# Patient Record
Sex: Female | Born: 1970 | Race: Black or African American | Hispanic: No | Marital: Married
Health system: Southern US, Community
[De-identification: ages and names within clinical notes are randomized; demographics above are authoritative.]

---

## 2013-12-29 ENCOUNTER — Ambulatory Visit: Payer: Self-pay | Admitting: Physician Assistant

## 2013-12-29 LAB — URINALYSIS, COMPLETE
Bacteria: NEGATIVE
Bilirubin,UR: NEGATIVE
GLUCOSE, UR: NEGATIVE
Ketone: NEGATIVE
NITRITE: NEGATIVE
PH: 6 (ref 5.0–8.0)
Protein: 300
SPECIFIC GRAVITY: 1.015 (ref 1.000–1.030)

## 2014-01-10 ENCOUNTER — Emergency Department: Payer: Self-pay | Admitting: Emergency Medicine

## 2014-02-06 ENCOUNTER — Ambulatory Visit: Payer: Self-pay | Admitting: Emergency Medicine

## 2015-10-09 ENCOUNTER — Emergency Department
Admission: EM | Admit: 2015-10-09 | Discharge: 2015-10-09 | Disposition: A | Discharge status: Home / Self Care | Payer: BLUE CROSS/BLUE SHIELD | Attending: Emergency Medicine | Admitting: Emergency Medicine

## 2015-10-09 DIAGNOSIS — Z5321 Procedure and treatment not carried out due to patient leaving prior to being seen by health care provider: Secondary | ICD-10-CM | POA: Insufficient documentation

## 2015-10-09 DIAGNOSIS — K0889 Other specified disorders of teeth and supporting structures: Secondary | ICD-10-CM | POA: Diagnosis present

## 2015-10-09 NOTE — ED Notes (Signed)
Pt in with co toothache yest and tonight woke up with right sided facial swelling.

## 2016-02-17 IMAGING — CR RIGHT INDEX FINGER 2+V
1 series · 3 of 3 positions shown · non-contrast
Comparison: None.

CLINICAL DATA: Slammed hand in car door. Black and blue under nail.
Pain at DIP joint.

EXAM:
RIGHT INDEX FINGER 2+V

[Series 1: dxr finger index 2nd digit rt ha · 0.14mm/px · 3 of 3 slices shown]
[im 1/3]
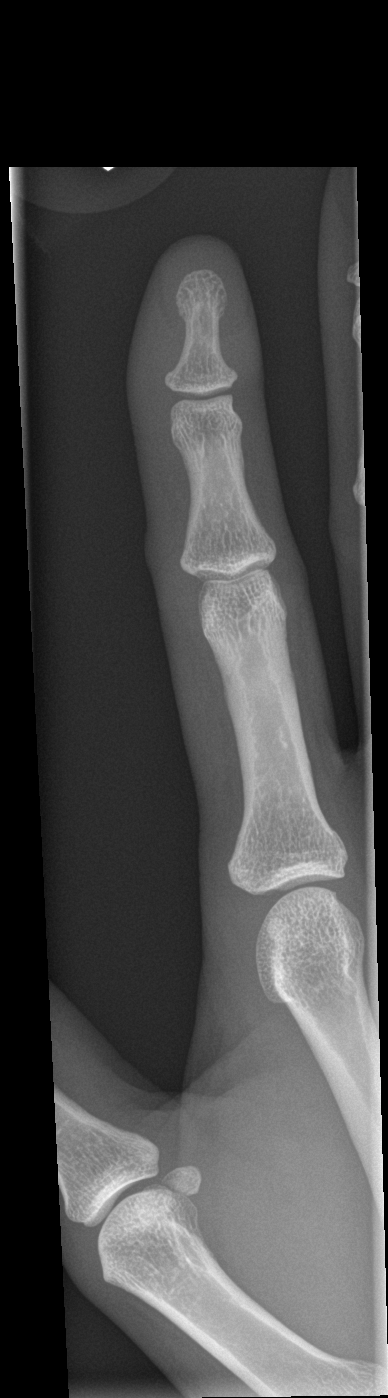
[im 2/3]
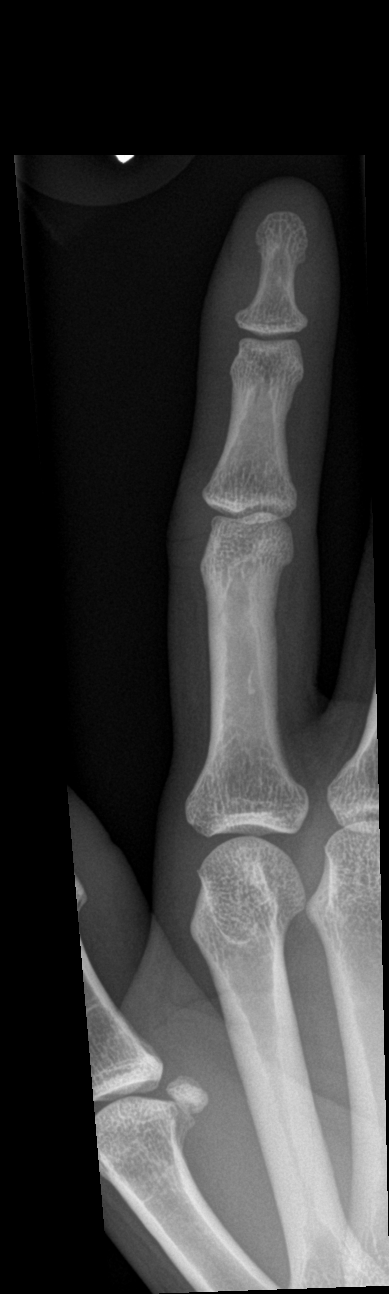
[im 3/3]
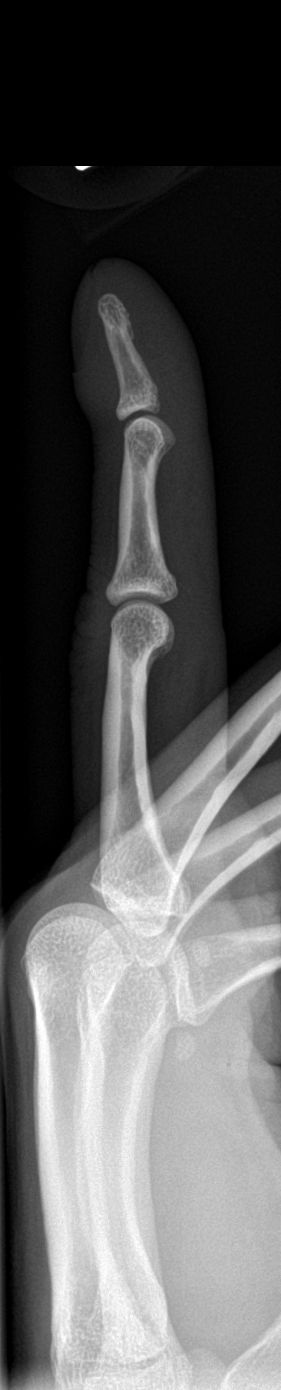

[3 of 3 positions shown; findings below may reference images not displayed]

FINDINGS: Soft tissue swelling of the right second finger. Slight angulation
demonstrated at the proximal interphalangeal joint with increased
extension of the distal interphalangeal joint. Changes may be due to
patient positioning but ligamentous injury may also be present. No
evidence of acute fracture.
IMPRESSION: No acute fractures demonstrated. Soft tissue swelling. Ligamentous
injury not excluded.
